# Patient Record
Sex: Female | Born: 1970 | Race: Black or African American | Hispanic: No | Marital: Married | Smoking: Never smoker
Health system: Southern US, Community
[De-identification: ages and names within clinical notes are randomized; demographics above are authoritative.]

## PROBLEM LIST (undated history)

## (undated) DIAGNOSIS — F32A Depression, unspecified: Secondary | ICD-10-CM

## (undated) DIAGNOSIS — N6452 Nipple discharge: Secondary | ICD-10-CM

## (undated) DIAGNOSIS — M1990 Unspecified osteoarthritis, unspecified site: Secondary | ICD-10-CM

## (undated) DIAGNOSIS — F419 Anxiety disorder, unspecified: Secondary | ICD-10-CM

## (undated) DIAGNOSIS — D242 Benign neoplasm of left breast: Secondary | ICD-10-CM

## (undated) DIAGNOSIS — F329 Major depressive disorder, single episode, unspecified: Secondary | ICD-10-CM

## (undated) HISTORY — PX: TUBAL LIGATION: SHX77

## (undated) HISTORY — PX: SHOULDER ARTHROSCOPY: SHX128

---

## 2016-12-03 ENCOUNTER — Other Ambulatory Visit: Payer: Self-pay | Admitting: Surgical Oncology

## 2016-12-03 DIAGNOSIS — R922 Inconclusive mammogram: Secondary | ICD-10-CM

## 2016-12-03 DIAGNOSIS — N6452 Nipple discharge: Secondary | ICD-10-CM

## 2016-12-24 DIAGNOSIS — N6452 Nipple discharge: Secondary | ICD-10-CM

## 2016-12-24 HISTORY — DX: Nipple discharge: N64.52

## 2016-12-25 ENCOUNTER — Ambulatory Visit
Admission: RE | Admit: 2016-12-25 | Discharge: 2016-12-25 | Disposition: A | Discharge status: Home / Self Care | Payer: Medicaid Other | Source: Ambulatory Visit | Attending: Surgical Oncology | Admitting: Surgical Oncology

## 2016-12-25 ENCOUNTER — Other Ambulatory Visit: Payer: Self-pay | Admitting: Surgical Oncology

## 2016-12-25 DIAGNOSIS — N6320 Unspecified lump in the left breast, unspecified quadrant: Secondary | ICD-10-CM

## 2016-12-25 DIAGNOSIS — N6452 Nipple discharge: Secondary | ICD-10-CM

## 2016-12-25 DIAGNOSIS — R922 Inconclusive mammogram: Secondary | ICD-10-CM

## 2016-12-25 HISTORY — DX: Nipple discharge: N64.52

## 2017-01-08 ENCOUNTER — Other Ambulatory Visit: Payer: Self-pay | Admitting: General Surgery

## 2017-01-08 DIAGNOSIS — D242 Benign neoplasm of left breast: Secondary | ICD-10-CM

## 2017-01-16 ENCOUNTER — Other Ambulatory Visit: Payer: Self-pay | Admitting: General Surgery

## 2017-01-16 DIAGNOSIS — D242 Benign neoplasm of left breast: Secondary | ICD-10-CM

## 2017-01-17 NOTE — Pre-Procedure Instructions (Signed)
    Anne Nixon  01/17/2017      [id number] [place] - [address] AT [address] [place] [address] Phone: [phone] Fax: [phone]    Your procedure is scheduled on Tues. Mar. 27 at 1130 AM.  Report to [facility] at 930 A.M.  Call this number if you have problems the morning of surgery:[phone]   Remember:  Do not eat food or drink liquids after midnight.  Take these medicines the morning of surgery with A SIP OF WATER clonazepam (klonopin), levothyroxine (synthroid), loratadine (claritin), methocarbamol (robaxin), oxycodone (percocet)-if needed, proair HFA inhaler.  Stop taking any Vitamins or Herbal Medications. No Aspirin Goody's, BC's, Aleve, Advil, Motrin, Ibuprofen or Fish oil.   Do not wear jewelry, make-up or nail polish.  Do not wear lotions, powders, or perfumes, or deoderant.  Do not shave 48 hours prior to surgery.  M  Do not bring valuables to the hospital.  [facility] is not responsible for any belongings or valuables.  Contacts, dentures or bridgework may not be worn into surgery.  Leave your suitcase in the car.  After surgery it may be brought to your room.  For patients admitted to the hospital, discharge time will be determined by your treatment team.  Patients discharged the day of surgery will not be allowed to drive home.    Special instructions:  See attached  Please read over the following fact sheets that you were given. Pain Booklet, Coughing and Deep Breathing and Surgical Site Infection Prevention

## 2017-01-17 NOTE — Progress Notes (Signed)
PCP:  Cardiologist:  EKG:  Stress test:  ECHO:  Cardiac cath:  Chest x-ray:

## 2017-01-18 ENCOUNTER — Inpatient Hospital Stay: Admission: RE | Admit: 2017-01-18 | Payer: Medicaid Other | Source: Ambulatory Visit

## 2017-01-18 ENCOUNTER — Inpatient Hospital Stay (HOSPITAL_COMMUNITY)
Admission: RE | Admit: 2017-01-18 | Discharge: 2017-01-18 | Disposition: A | Discharge status: Home / Self Care | Payer: Medicaid Other | Source: Ambulatory Visit

## 2017-02-04 NOTE — Progress Notes (Signed)
Stephanie at Dr Darrel Hoover notified that we cannot reach patient, all #'s have no answer, no VM set up. There have been multiple attempts made to reach patient.

## 2017-02-06 ENCOUNTER — Ambulatory Visit
Admission: RE | Admit: 2017-02-06 | Discharge: 2017-02-06 | Disposition: A | Discharge status: Home / Self Care | Payer: Medicaid Other | Source: Ambulatory Visit | Attending: General Surgery | Admitting: General Surgery

## 2017-02-06 DIAGNOSIS — D242 Benign neoplasm of left breast: Secondary | ICD-10-CM

## 2017-02-06 NOTE — H&P (Signed)
Anne Nixon  Location: [facility] Patient #: [id number] DOB: 09-Mar-1971 Single / Language: [name] / Race: Black or African American Female        History of Present Illness        The patient is a 46 year old female who presents with a complaint of papilloma left breast. This is a 46 year old female, referred by Dr. Franki Nixon at the breast center at [facility] for evaluation of clear nipple discharge from the left breast and a biopsy of the left breast showing papilloma. Her PCP is Anne Nixon in [place] in [place]. Family practice resident Dr. Lajuana Nixon is present throughout the encounter      She has no prior history of breast problems. She gives a two-month history of clear left nipple discharge. Sometimes spontaneous and sometimes elicited. Imaging studies showed category B density but the mammogram is normal. Ultrasound, however, she has a dilated duct in the left retroareolar area. There is some echogenic material within the duct that has a vascular flow in the lower inner quadrant. Ultrasound of the axilla is negative. There is also imaging abnormality on the right.      Bilateral image guided biopsy showed fibrocystic disease on the right. Fragment of intraductal papilloma on the left. She was referred for excision on the left. The drainage has stopped on the left side since the biopsy which is not unusual.      Past history revealed hypertension and hypothyroidism but otherwise normal. Family history reveals mother died of colon cancer. Father died of heart failure. No family history of ovarian or breast cancer. She is single and lives in [place] had 5 children . One child died following an assault. Denies alcohol or tobacco. She is on unemployed. Takes care of the other children. Has a fiance who is supports her.       I have recommended biopsy of this area and she agrees. She will be scheduled for left breast lumpectomy with  radioactive seed localization. We will ask for lacrimal duct probes in case the duct is draining. I discussed the indications, details, techniques, and numerous risk of the surgery with her. She is aware of the risk of bleeding, infection, reoperation if cancer, high likelihood of nipple flattening, loss of erectile function, and permanent numbness. Other cosmetic deformity. Chronic pain. She understands these issues well. All of her questions are answered. She agrees with this plan.   Past Surgical History  Breast Biopsy  Bilateral. Knee Surgery  Left. Shoulder Surgery  Left.  Diagnostic Studies History  Colonoscopy  never Mammogram  within last year  Allergies  Ibuprin *ANALGESICS - ANTI-INFLAMMATORY*  Anaphylaxis. Naproxen *ANALGESICS - ANTI-INFLAMMATORY*  Anaphylaxis. Azithromycin *CHEMICALS*  Anaphylaxis.  Medication History  Lisinopril (20MG  Tablet, Oral) Active. HydroCHLOROthiazide (25MG  Tablet, Oral) Active. Levothyroxine Sodium (25MCG Tablet, Oral) Active. Loratadine (10MG  Tablet, Oral) Active. Medications Reconciled  Social History  No alcohol use  Tobacco use  Never smoker.  Family History  Alcohol Abuse  Family Members In General. Anesthetic complications  Family Members In General. Diabetes Mellitus  Father. Heart disease in female family member before age 36  Hypertension  Father.  Pregnancy / Birth History  Age at menarche  67 years. Gravida  5 Maternal age  81-25 Para  5 Regular periods   Other Problems  Arthritis  Asthma  Breast Cancer  Depression  High blood pressure  Thyroid Disease     Review of Systems  General Present- Chills and Weight Loss. Not  Present- Appetite Loss, Fatigue, Fever, Night Sweats and Weight Gain. Skin Present- Dryness. Not Present- Change in Wart/Mole, Hives, Jaundice, New Lesions, Non-Healing Wounds, Rash and Ulcer. HEENT Present- Seasonal Allergies. Not Present- Earache, Hearing  Loss, Hoarseness, Nose Bleed, Oral Ulcers, Ringing in the Ears, Sinus Pain, Sore Throat, Visual Disturbances, Wears glasses/contact lenses and Yellow Eyes. Breast Present- Breast Mass, Breast Pain and Nipple Discharge. Not Present- Skin Changes. Musculoskeletal Present- Back Pain and Joint Pain. Not Present- Joint Stiffness, Muscle Pain, Muscle Weakness and Swelling of Extremities. Neurological Present- Headaches and Tingling. Not Present- Decreased Memory, Fainting, Numbness, Seizures, Tremor, Trouble walking and Weakness. Psychiatric Present- Anxiety and Depression. Not Present- Bipolar, Change in Sleep Pattern, Fearful and Frequent crying.  Vitals  Weight: 145.8 lb Height: 61in Body Surface Area: 1.65 m Body Mass Index: 27.55 kg/m  Temp.: 98.18F  Pulse: 78 (Regular)  BP: 124/86 (Sitting, Left Arm, Standard)    Physical Exam  General Mental Status-Alert. General Appearance-Consistent with stated age. Hydration-Well hydrated. Voice-Normal.  Head and Neck Head-normocephalic, atraumatic with no lesions or palpable masses. Trachea-midline. Thyroid Gland Characteristics - normal size and consistency.  Eye Eyeball - Bilateral-Extraocular movements intact. Sclera/Conjunctiva - Bilateral-No scleral icterus.  Chest and Lung Exam Chest and lung exam reveals -quiet, even and easy respiratory effort with no use of accessory muscles and on auscultation, normal breath sounds, no adventitious sounds and normal vocal resonance. Inspection Chest Wall - Normal. Back - normal.  Breast Note: Breasts are medium size. Minimal ecchymoses on left. No hematoma. No palpable mass on either side. Nipple and areolar complexes looked normal. I could not elicit drainage from the left nipple. There is no axillary adenopathy on either side.   Cardiovascular Cardiovascular examination reveals -normal heart sounds, regular rate and rhythm with no murmurs and normal pedal  pulses bilaterally.  Abdomen Inspection Inspection of the abdomen reveals - No Hernias. Skin - Scar - no surgical scars. Palpation/Percussion Palpation and Percussion of the abdomen reveal - Soft, Non Tender, No Rebound tenderness, No Rigidity (guarding) and No hepatosplenomegaly. Auscultation Auscultation of the abdomen reveals - Bowel sounds normal.  Neurologic Neurologic evaluation reveals -alert and oriented x 3 with no impairment of recent or remote memory. Mental Status-Normal.  Musculoskeletal Normal Exam - Left-Upper Extremity Strength Normal and Lower Extremity Strength Normal. Normal Exam - Right-Upper Extremity Strength Normal and Lower Extremity Strength Normal.  Lymphatic Head & Neck  General Head & Neck Lymphatics: Bilateral - Description - Normal. Axillary  General Axillary Region: Bilateral - Description - Normal. Tenderness - Non Tender. Femoral & Inguinal  Generalized Femoral & Inguinal Lymphatics: Bilateral - Description - Normal. Tenderness - Non Tender.    Assessment & Plan  INTRADUCTAL PAPILLOMA OF BREAST, LEFT (D24.2)   You're imaging studies and biopsy show a papilloma in the left breast, slightly lower inner quadrant. The drainage has stopped since you had the biopsy and that is not unusual. The biopsy of your right breast is completely benign  Because of the clear drainage from your left nipple and the diagnosis of papilloma, there is a low but definite risk of low-grade cancer being present Excisional lumpectomy is recommended and he has stated that she would like to go ahead with that surgery you will be scheduled for left breast lumpectomy with radioactive seed localization We have discussed the indications, techniques, and risk of that surgery in detail.   HYPERTENSION, ESSENTIAL (I10) HYPOTHYROIDISM, ADULT (E03.9) FAMILY HISTORY OF COLON CANCER IN MOTHER (Z80.0)    Assata Juncaj M.  Dalbert Batman, M.D., [facility] General and Minimally invasive Surgery Breast and Colorectal Surgery Office:   [phone] Pager:   [phone]

## 2017-02-07 ENCOUNTER — Encounter (HOSPITAL_BASED_OUTPATIENT_CLINIC_OR_DEPARTMENT_OTHER)
Admission: RE | Admit: 2017-02-07 | Discharge: 2017-02-07 | Disposition: A | Discharge status: Home / Self Care | Payer: Medicaid Other | Source: Ambulatory Visit | Attending: General Surgery | Admitting: General Surgery

## 2017-02-07 ENCOUNTER — Other Ambulatory Visit: Payer: Self-pay

## 2017-02-07 ENCOUNTER — Encounter (HOSPITAL_BASED_OUTPATIENT_CLINIC_OR_DEPARTMENT_OTHER): Payer: Self-pay | Admitting: *Deleted

## 2017-02-07 DIAGNOSIS — I10 Essential (primary) hypertension: Secondary | ICD-10-CM | POA: Insufficient documentation

## 2017-02-07 DIAGNOSIS — F419 Anxiety disorder, unspecified: Secondary | ICD-10-CM | POA: Diagnosis not present

## 2017-02-07 DIAGNOSIS — D242 Benign neoplasm of left breast: Secondary | ICD-10-CM | POA: Diagnosis not present

## 2017-02-07 DIAGNOSIS — Z79899 Other long term (current) drug therapy: Secondary | ICD-10-CM | POA: Diagnosis not present

## 2017-02-07 DIAGNOSIS — E039 Hypothyroidism, unspecified: Secondary | ICD-10-CM | POA: Diagnosis not present

## 2017-02-07 DIAGNOSIS — Z8249 Family history of ischemic heart disease and other diseases of the circulatory system: Secondary | ICD-10-CM | POA: Diagnosis not present

## 2017-02-07 DIAGNOSIS — M1990 Unspecified osteoarthritis, unspecified site: Secondary | ICD-10-CM | POA: Diagnosis not present

## 2017-02-07 DIAGNOSIS — F329 Major depressive disorder, single episode, unspecified: Secondary | ICD-10-CM | POA: Diagnosis not present

## 2017-02-07 LAB — COMPREHENSIVE METABOLIC PANEL
ALT: 18 U/L (ref 14–54)
AST: 23 U/L (ref 15–41)
Albumin: 3.7 g/dL (ref 3.5–5.0)
Alkaline Phosphatase: 53 U/L (ref 38–126)
Anion gap: 10 (ref 5–15)
BUN: 11 mg/dL (ref 6–20)
CHLORIDE: 101 mmol/L (ref 101–111)
CO2: 27 mmol/L (ref 22–32)
Calcium: 8.7 mg/dL — ABNORMAL LOW (ref 8.9–10.3)
Creatinine, Ser: 0.99 mg/dL (ref 0.44–1.00)
Glucose, Bld: 130 mg/dL — ABNORMAL HIGH (ref 65–99)
POTASSIUM: 3 mmol/L — AB (ref 3.5–5.1)
SODIUM: 138 mmol/L (ref 135–145)
Total Bilirubin: 0.4 mg/dL (ref 0.3–1.2)
Total Protein: 6.6 g/dL (ref 6.5–8.1)

## 2017-02-07 LAB — CBC WITH DIFFERENTIAL/PLATELET
BASOS ABS: 0 10*3/uL (ref 0.0–0.1)
Basophils Relative: 0 %
EOS ABS: 0.2 10*3/uL (ref 0.0–0.7)
EOS PCT: 4 %
HCT: 37.8 % (ref 36.0–46.0)
Hemoglobin: 12.5 g/dL (ref 12.0–15.0)
LYMPHS PCT: 42 %
Lymphs Abs: 2.1 10*3/uL (ref 0.7–4.0)
MCH: 29.1 pg (ref 26.0–34.0)
MCHC: 33.1 g/dL (ref 30.0–36.0)
MCV: 88.1 fL (ref 78.0–100.0)
MONO ABS: 0.4 10*3/uL (ref 0.1–1.0)
Monocytes Relative: 8 %
Neutro Abs: 2.3 10*3/uL (ref 1.7–7.7)
Neutrophils Relative %: 46 %
PLATELETS: 262 10*3/uL (ref 150–400)
RBC: 4.29 MIL/uL (ref 3.87–5.11)
RDW: 13.7 % (ref 11.5–15.5)
WBC: 5 10*3/uL (ref 4.0–10.5)

## 2017-02-07 NOTE — Progress Notes (Signed)
Lab results reviewed by Dr. [employer], K+-3.0, will recheck ISTAT day of surgery.

## 2017-02-07 NOTE — Progress Notes (Signed)
Pt in for Pre Admission Testing and Interview.  EKG and Labs done. Boost Breeze given and instructions for dos reviewed with pt. Verbalized understanding.

## 2017-02-08 ENCOUNTER — Ambulatory Visit: Admit: 2017-02-08 | Payer: Medicaid Other | Admitting: General Surgery

## 2017-02-08 ENCOUNTER — Ambulatory Visit (HOSPITAL_BASED_OUTPATIENT_CLINIC_OR_DEPARTMENT_OTHER)
Admission: RE | Admit: 2017-02-08 | Discharge: 2017-02-08 | Disposition: A | Discharge status: Home / Self Care | Payer: Medicaid Other | Source: Ambulatory Visit | Attending: General Surgery | Admitting: General Surgery

## 2017-02-08 ENCOUNTER — Encounter (HOSPITAL_BASED_OUTPATIENT_CLINIC_OR_DEPARTMENT_OTHER): Payer: Self-pay | Admitting: *Deleted

## 2017-02-08 ENCOUNTER — Ambulatory Visit (HOSPITAL_BASED_OUTPATIENT_CLINIC_OR_DEPARTMENT_OTHER): Payer: Medicaid Other | Admitting: Anesthesiology

## 2017-02-08 ENCOUNTER — Encounter (HOSPITAL_BASED_OUTPATIENT_CLINIC_OR_DEPARTMENT_OTHER)
Admission: RE | Disposition: A | Discharge status: Home / Self Care | Payer: Self-pay | Source: Ambulatory Visit | Attending: General Surgery

## 2017-02-08 ENCOUNTER — Ambulatory Visit
Admission: RE | Admit: 2017-02-08 | Discharge: 2017-02-08 | Disposition: A | Discharge status: Home / Self Care | Payer: Medicaid Other | Source: Ambulatory Visit | Attending: General Surgery | Admitting: General Surgery

## 2017-02-08 DIAGNOSIS — F329 Major depressive disorder, single episode, unspecified: Secondary | ICD-10-CM | POA: Insufficient documentation

## 2017-02-08 DIAGNOSIS — E039 Hypothyroidism, unspecified: Secondary | ICD-10-CM | POA: Diagnosis not present

## 2017-02-08 DIAGNOSIS — D242 Benign neoplasm of left breast: Secondary | ICD-10-CM

## 2017-02-08 DIAGNOSIS — I10 Essential (primary) hypertension: Secondary | ICD-10-CM | POA: Diagnosis not present

## 2017-02-08 DIAGNOSIS — M1990 Unspecified osteoarthritis, unspecified site: Secondary | ICD-10-CM | POA: Insufficient documentation

## 2017-02-08 DIAGNOSIS — Z8249 Family history of ischemic heart disease and other diseases of the circulatory system: Secondary | ICD-10-CM | POA: Insufficient documentation

## 2017-02-08 DIAGNOSIS — F419 Anxiety disorder, unspecified: Secondary | ICD-10-CM | POA: Insufficient documentation

## 2017-02-08 DIAGNOSIS — Z79899 Other long term (current) drug therapy: Secondary | ICD-10-CM | POA: Insufficient documentation

## 2017-02-08 HISTORY — DX: Anxiety disorder, unspecified: F41.9

## 2017-02-08 HISTORY — DX: Major depressive disorder, single episode, unspecified: F32.9

## 2017-02-08 HISTORY — DX: Benign neoplasm of left breast: D24.2

## 2017-02-08 HISTORY — DX: Unspecified osteoarthritis, unspecified site: M19.90

## 2017-02-08 HISTORY — PX: BREAST LUMPECTOMY WITH RADIOACTIVE SEED LOCALIZATION: SHX6424

## 2017-02-08 HISTORY — DX: Depression, unspecified: F32.A

## 2017-02-08 LAB — POCT I-STAT, CHEM 8
BUN: 11 mg/dL (ref 6–20)
CHLORIDE: 99 mmol/L — AB (ref 101–111)
Calcium, Ion: 1.05 mmol/L — ABNORMAL LOW (ref 1.15–1.40)
Creatinine, Ser: 0.9 mg/dL (ref 0.44–1.00)
Glucose, Bld: 105 mg/dL — ABNORMAL HIGH (ref 65–99)
HEMATOCRIT: 39 % (ref 36.0–46.0)
Hemoglobin: 13.3 g/dL (ref 12.0–15.0)
Potassium: 3 mmol/L — ABNORMAL LOW (ref 3.5–5.1)
SODIUM: 138 mmol/L (ref 135–145)
TCO2: 29 mmol/L (ref 0–100)

## 2017-02-08 SURGERY — BREAST LUMPECTOMY WITH RADIOACTIVE SEED LOCALIZATION
Anesthesia: General | Site: Breast | Laterality: Left

## 2017-02-08 SURGERY — BREAST LUMPECTOMY WITH RADIOACTIVE SEED LOCALIZATION
Anesthesia: General | Site: Breast | Laterality: Right

## 2017-02-08 MED ORDER — DEXAMETHASONE SODIUM PHOSPHATE 10 MG/ML IJ SOLN
INTRAMUSCULAR | Status: AC
  Filled 2017-02-08: qty 1

## 2017-02-08 MED ORDER — EPHEDRINE 5 MG/ML INJ
INTRAVENOUS | Status: AC
  Filled 2017-02-08: qty 10

## 2017-02-08 MED ORDER — OXYCODONE HCL 5 MG/5ML PO SOLN: 5.0000 mg | Freq: Once | ORAL | Status: DC | PRN

## 2017-02-08 MED ORDER — FENTANYL CITRATE (PF) 100 MCG/2ML IJ SOLN
25.0000 ug | INTRAMUSCULAR | Status: DC | PRN
  Administered 2017-02-08 (×2): 50 ug via INTRAVENOUS

## 2017-02-08 MED ORDER — MIDAZOLAM HCL 2 MG/2ML IJ SOLN
INTRAMUSCULAR | Status: AC
  Filled 2017-02-08: qty 2

## 2017-02-08 MED ORDER — LIDOCAINE HCL (CARDIAC) 20 MG/ML IV SOLN
INTRAVENOUS | Status: DC | PRN
  Administered 2017-02-08: 30 mg via INTRAVENOUS

## 2017-02-08 MED ORDER — FENTANYL CITRATE (PF) 100 MCG/2ML IJ SOLN
INTRAMUSCULAR | Status: AC
  Filled 2017-02-08: qty 2

## 2017-02-08 MED ORDER — ACETAMINOPHEN 500 MG PO TABS
ORAL_TABLET | ORAL | Status: AC
  Filled 2017-02-08: qty 2

## 2017-02-08 MED ORDER — SODIUM BICARBONATE 4 % IV SOLN
INTRAVENOUS | Status: DC | PRN
  Administered 2017-02-08: 2 mL

## 2017-02-08 MED ORDER — EPHEDRINE SULFATE 50 MG/ML IJ SOLN
INTRAMUSCULAR | Status: DC | PRN
  Administered 2017-02-08: 20 mg via INTRAVENOUS

## 2017-02-08 MED ORDER — CHLORHEXIDINE GLUCONATE CLOTH 2 % EX PADS: 6.0000 | MEDICATED_PAD | Freq: Once | CUTANEOUS | Status: DC

## 2017-02-08 MED ORDER — LIDOCAINE 2% (20 MG/ML) 5 ML SYRINGE
INTRAMUSCULAR | Status: AC
  Filled 2017-02-08: qty 5

## 2017-02-08 MED ORDER — CEFAZOLIN SODIUM-DEXTROSE 2-4 GM/100ML-% IV SOLN
INTRAVENOUS | Status: AC
  Filled 2017-02-08: qty 100

## 2017-02-08 MED ORDER — ONDANSETRON HCL 4 MG/2ML IJ SOLN
INTRAMUSCULAR | Status: AC
  Filled 2017-02-08: qty 2

## 2017-02-08 MED ORDER — OXYCODONE HCL 5 MG PO TABS: 5.0000 mg | ORAL_TABLET | Freq: Once | ORAL | Status: DC | PRN

## 2017-02-08 MED ORDER — PROPOFOL 10 MG/ML IV BOLUS
INTRAVENOUS | Status: DC | PRN
  Administered 2017-02-08: 150 mg via INTRAVENOUS

## 2017-02-08 MED ORDER — LACTATED RINGERS IV SOLN: INTRAVENOUS | Status: DC

## 2017-02-08 MED ORDER — SODIUM CHLORIDE 0.9% FLUSH: 3.0000 mL | Freq: Two times a day (BID) | INTRAVENOUS | Status: DC

## 2017-02-08 MED ORDER — FENTANYL CITRATE (PF) 100 MCG/2ML IJ SOLN
50.0000 ug | INTRAMUSCULAR | Status: DC | PRN
  Administered 2017-02-08: 100 ug via INTRAVENOUS

## 2017-02-08 MED ORDER — MIDAZOLAM HCL 2 MG/2ML IJ SOLN
1.0000 mg | INTRAMUSCULAR | Status: DC | PRN
  Administered 2017-02-08: 2 mg via INTRAVENOUS

## 2017-02-08 MED ORDER — LIDOCAINE-EPINEPHRINE 1 %-1:100000 IJ SOLN
INTRAMUSCULAR | Status: DC | PRN
  Administered 2017-02-08: 18 mL

## 2017-02-08 MED ORDER — SUCCINYLCHOLINE CHLORIDE 200 MG/10ML IV SOSY
PREFILLED_SYRINGE | INTRAVENOUS | Status: AC
  Filled 2017-02-08: qty 10

## 2017-02-08 MED ORDER — GABAPENTIN 300 MG PO CAPS
ORAL_CAPSULE | ORAL | Status: AC
  Filled 2017-02-08: qty 1

## 2017-02-08 MED ORDER — ACETAMINOPHEN 325 MG PO TABS: 650.0000 mg | ORAL_TABLET | ORAL | Status: DC | PRN

## 2017-02-08 MED ORDER — HYDROCODONE-ACETAMINOPHEN 5-325 MG PO TABS: 1.0000 | ORAL_TABLET | Freq: Four times a day (QID) | ORAL | 0 refills | Status: AC | PRN

## 2017-02-08 MED ORDER — SCOPOLAMINE 1 MG/3DAYS TD PT72
1.0000 | MEDICATED_PATCH | Freq: Once | TRANSDERMAL | Status: DC | PRN
Start: 2017-02-08 — End: 2017-02-08

## 2017-02-08 MED ORDER — SODIUM CHLORIDE 0.9 % IV SOLN: 250.0000 mL | INTRAVENOUS | Status: DC | PRN

## 2017-02-08 MED ORDER — PROPOFOL 10 MG/ML IV BOLUS
INTRAVENOUS | Status: AC
  Filled 2017-02-08: qty 20

## 2017-02-08 MED ORDER — OXYCODONE HCL 5 MG PO TABS: 5.0000 mg | ORAL_TABLET | ORAL | Status: DC | PRN

## 2017-02-08 MED ORDER — SODIUM CHLORIDE 0.9% FLUSH: 3.0000 mL | INTRAVENOUS | Status: DC | PRN

## 2017-02-08 MED ORDER — ACETAMINOPHEN 650 MG RE SUPP: 650.0000 mg | RECTAL | Status: DC | PRN

## 2017-02-08 MED ORDER — CEFAZOLIN SODIUM-DEXTROSE 2-4 GM/100ML-% IV SOLN
2.0000 g | INTRAVENOUS | Status: AC
  Administered 2017-02-08: 2 g via INTRAVENOUS

## 2017-02-08 MED ORDER — CELECOXIB 200 MG PO CAPS
ORAL_CAPSULE | ORAL | Status: AC
Start: 2017-02-08 — End: 2017-02-08
  Filled 2017-02-08: qty 2

## 2017-02-08 MED ORDER — DEXAMETHASONE SODIUM PHOSPHATE 4 MG/ML IJ SOLN
INTRAMUSCULAR | Status: DC | PRN
  Administered 2017-02-08: 10 mg via INTRAVENOUS

## 2017-02-08 MED ORDER — ACETAMINOPHEN 500 MG PO TABS
1000.0000 mg | ORAL_TABLET | ORAL | Status: AC
  Administered 2017-02-08: 1000 mg via ORAL

## 2017-02-08 MED ORDER — LACTATED RINGERS IV SOLN
INTRAVENOUS | Status: DC
  Administered 2017-02-08 (×2): via INTRAVENOUS

## 2017-02-08 MED ORDER — SODIUM BICARBONATE 4 % IV SOLN
INTRAVENOUS | Status: AC
  Filled 2017-02-08: qty 5

## 2017-02-08 MED ORDER — ONDANSETRON HCL 4 MG/2ML IJ SOLN
INTRAMUSCULAR | Status: DC | PRN
  Administered 2017-02-08: 4 mg via INTRAVENOUS

## 2017-02-08 MED ORDER — GABAPENTIN 300 MG PO CAPS
300.0000 mg | ORAL_CAPSULE | ORAL | Status: AC
  Administered 2017-02-08: 300 mg via ORAL

## 2017-02-08 MED ORDER — PHENYLEPHRINE 40 MCG/ML (10ML) SYRINGE FOR IV PUSH (FOR BLOOD PRESSURE SUPPORT)
PREFILLED_SYRINGE | INTRAVENOUS | Status: AC
  Filled 2017-02-08: qty 10

## 2017-02-08 MED ORDER — CELECOXIB 400 MG PO CAPS
400.0000 mg | ORAL_CAPSULE | ORAL | Status: AC
  Administered 2017-02-08: 400 mg via ORAL

## 2017-02-08 MED ORDER — FENTANYL CITRATE (PF) 100 MCG/2ML IJ SOLN: 25.0000 ug | INTRAMUSCULAR | Status: DC | PRN

## 2017-02-08 SURGICAL SUPPLY — 59 items
APPLIER CLIP 9.375 MED OPEN (MISCELLANEOUS)
BENZOIN TINCTURE PRP APPL 2/3 (GAUZE/BANDAGES/DRESSINGS) IMPLANT
BINDER BREAST LRG (GAUZE/BANDAGES/DRESSINGS) ×3 IMPLANT
BINDER BREAST MEDIUM (GAUZE/BANDAGES/DRESSINGS) IMPLANT
BINDER BREAST XLRG (GAUZE/BANDAGES/DRESSINGS) IMPLANT
BINDER BREAST XXLRG (GAUZE/BANDAGES/DRESSINGS) IMPLANT
BLADE HEX COATED 2.75 (ELECTRODE) ×3 IMPLANT
BLADE SURG 10 STRL SS (BLADE) IMPLANT
BLADE SURG 15 STRL LF DISP TIS (BLADE) ×1 IMPLANT
BLADE SURG 15 STRL SS (BLADE) ×2
CANISTER SUC SOCK COL 7IN (MISCELLANEOUS) IMPLANT
CANISTER SUCT 1200ML W/VALVE (MISCELLANEOUS) ×3 IMPLANT
CHLORAPREP W/TINT 26ML (MISCELLANEOUS) ×3 IMPLANT
CLIP APPLIE 9.375 MED OPEN (MISCELLANEOUS) IMPLANT
CLOSURE WOUND 1/2 X4 (GAUZE/BANDAGES/DRESSINGS)
COVER BACK TABLE 60X90IN (DRAPES) ×3 IMPLANT
COVER MAYO STAND STRL (DRAPES) ×3 IMPLANT
COVER PROBE W GEL 5X96 (DRAPES) ×3 IMPLANT
DECANTER SPIKE VIAL GLASS SM (MISCELLANEOUS) IMPLANT
DERMABOND ADVANCED (GAUZE/BANDAGES/DRESSINGS) ×2
DERMABOND ADVANCED .7 DNX12 (GAUZE/BANDAGES/DRESSINGS) ×1 IMPLANT
DEVICE DUBIN W/COMP PLATE 8390 (MISCELLANEOUS) ×3 IMPLANT
DRAPE LAPAROSCOPIC ABDOMINAL (DRAPES) ×3 IMPLANT
DRAPE UTILITY XL STRL (DRAPES) ×3 IMPLANT
DRSG PAD ABDOMINAL 8X10 ST (GAUZE/BANDAGES/DRESSINGS) IMPLANT
ELECT REM PT RETURN 9FT ADLT (ELECTROSURGICAL) ×3
ELECTRODE REM PT RTRN 9FT ADLT (ELECTROSURGICAL) ×1 IMPLANT
GAUZE SPONGE 4X4 12PLY STRL LF (GAUZE/BANDAGES/DRESSINGS) IMPLANT
GLOVE EUDERMIC 7 POWDERFREE (GLOVE) ×3 IMPLANT
GOWN STRL REUS W/ TWL LRG LVL3 (GOWN DISPOSABLE) ×1 IMPLANT
GOWN STRL REUS W/ TWL XL LVL3 (GOWN DISPOSABLE) ×1 IMPLANT
GOWN STRL REUS W/TWL LRG LVL3 (GOWN DISPOSABLE) ×2
GOWN STRL REUS W/TWL XL LVL3 (GOWN DISPOSABLE) ×2
ILLUMINATOR WAVEGUIDE N/F (MISCELLANEOUS) IMPLANT
KIT MARKER MARGIN INK (KITS) ×3 IMPLANT
LIGHT WAVEGUIDE WIDE FLAT (MISCELLANEOUS) IMPLANT
NEEDLE HYPO 25X1 1.5 SAFETY (NEEDLE) ×3 IMPLANT
NS IRRIG 1000ML POUR BTL (IV SOLUTION) ×3 IMPLANT
PACK BASIN DAY SURGERY FS (CUSTOM PROCEDURE TRAY) ×3 IMPLANT
PENCIL BUTTON HOLSTER BLD 10FT (ELECTRODE) ×3 IMPLANT
SHEET MEDIUM DRAPE 40X70 STRL (DRAPES) IMPLANT
SLEEVE SCD COMPRESS KNEE MED (MISCELLANEOUS) ×3 IMPLANT
SPONGE LAP 18X18 X RAY DECT (DISPOSABLE) IMPLANT
SPONGE LAP 4X18 X RAY DECT (DISPOSABLE) ×3 IMPLANT
STRIP CLOSURE SKIN 1/2X4 (GAUZE/BANDAGES/DRESSINGS) IMPLANT
SUT ETHILON 3 0 FSL (SUTURE) IMPLANT
SUT MNCRL AB 4-0 PS2 18 (SUTURE) ×3 IMPLANT
SUT SILK 2 0 SH (SUTURE) ×3 IMPLANT
SUT VIC AB 2-0 CT1 27 (SUTURE)
SUT VIC AB 2-0 CT1 TAPERPNT 27 (SUTURE) IMPLANT
SUT VIC AB 3-0 SH 27 (SUTURE)
SUT VIC AB 3-0 SH 27X BRD (SUTURE) IMPLANT
SUT VICRYL 3-0 CR8 SH (SUTURE) ×3 IMPLANT
SYR 10ML LL (SYRINGE) ×3 IMPLANT
TOWEL OR 17X24 6PK STRL BLUE (TOWEL DISPOSABLE) ×3 IMPLANT
TOWEL OR NON WOVEN STRL DISP B (DISPOSABLE) ×3 IMPLANT
TUBE CONNECTING 20'X1/4 (TUBING) ×1
TUBE CONNECTING 20X1/4 (TUBING) ×2 IMPLANT
YANKAUER SUCT BULB TIP NO VENT (SUCTIONS) ×3 IMPLANT

## 2017-02-08 NOTE — Anesthesia Procedure Notes (Signed)
Procedure Name: LMA Insertion Date/Time: 02/08/2017 9:28 AM Performed by: Melynda Ripple D Pre-anesthesia Checklist: Patient identified, Emergency Drugs available, Suction available and Patient being monitored Patient Re-evaluated:Patient Re-evaluated prior to inductionOxygen Delivery Method: Circle system utilized Preoxygenation: Pre-oxygenation with 100% oxygen Intubation Type: IV induction Ventilation: Mask ventilation without difficulty LMA: LMA inserted LMA Size: 3.0 Number of attempts: 1 Airway Equipment and Method: Bite block Placement Confirmation: positive ETCO2 Tube secured with: Tape Dental Injury: Teeth and Oropharynx as per pre-operative assessment

## 2017-02-08 NOTE — Op Note (Addendum)
Patient Name:           Anne Nixon   Date of Surgery:        02/08/2017  Pre op Diagnosis:      Papilloma left breast  Post op Diagnosis:    Papilloma left breast  Procedure:                 Left breast lumpectomy with radioactive seed localization and margin assessment  Surgeon:                     Edsel Petrin. Dalbert Batman, M.D., FACS  Assistant:                      OR staff   Indication for Assistant: n/a  Operative Indications:    This is a 46 year old female, referred by Dr. Franki Cabot at the breast center at [facility] for evaluation of clear nipple discharge from the left breast and a biopsy of the left breast showing papilloma. Her PCP is [facility] in [place] in [place].       She has no prior history of breast problems. She gives a two-month history of clear left nipple discharge. Sometimes spontaneous and sometimes elicited. Imaging studies showed category B density but the mammogram is normal. Ultrasound, however, she has a dilated duct in the left retroareolar area. There is some echogenic material within the duct that has a vascular flow in the lower inner quadrant. Ultrasound of the axilla is negative. There is also imaging abnormality on the right.      Bilateral image guided biopsy showed fibrocystic disease on the right. Fragment of intraductal papilloma on the left. She was referred for excision on the left. The drainage has stopped on the left side since the biopsy which is not unusual.       I have recommended biopsy of this area and she agrees. She will be scheduled for left breast lumpectomy with radioactive seed localization.She agrees with this plan.  Operative Findings:       The biopsy marker in the radioactive seed were at the 6:00 position of the left breast.  This was about 3 cm inferior to the areolar margin but we were able to get this out through a circumareolar incision.  I was able to elicit some clear straw-colored drainage from a  central left nipple duct.  I tried repeatedly to intubate this with a lacrimal duct probe but it would not go more than about 5 mm deep.  The specimen mammogram looked good with the marker clip and the seed within the relative center of the specimen.  Procedure in Detail:          Following the induction of general LMA anesthesia the patient's left breast was prepped and draped in a sterile fashion.  Surgical timeout was performed.  Intravenous antibiotics were given.  1% Xylocaine with epinephrine was used as a local infiltration anesthetic.     I attempted to intubate the draining duct with lacrimal duct probes but was unsuccessful.  I made a circumareolar incision at the areolar margin inferiorly.  Using the neoprobe as a guide I dissected out a specimen about 2.5 cm in diameter or less. .  The specimen was removed and marked with sutures and a 6 color ink kit to orient the pathologist.  The specimen mammogram looked good as described above and the specimen was marked and sent to the lab.  Hemostasis was  excellent.  The wound was irrigated.  The lumpectomy cavity was marked with 5 metal clips according to our protocol.  The breast tissues were reapproximated with several interrupted sutures of 3-0 Vicryl and the skin closed with a running subcuticular 4-0 Monocryl and Dermabond.  Breast binder was placed and the patient taken to PACU in stable condition.  EBL 10 mL.  Counts correct.  Complications none.     Edsel Petrin. Dalbert Batman, M.D., FACS General and Minimally Invasive Surgery Breast and Colorectal Surgery   Addendum: The patient was given a prescription for Norco for postop pain.  I locked onto the [place] website and reviewed her prescription medication history   02/08/2017 10:21 AM

## 2017-02-08 NOTE — Anesthesia Postprocedure Evaluation (Addendum)
Anesthesia Post Note  Patient: Anne Nixon  Procedure(s) Performed: Procedure(s) (LRB): LEFT BREAST LUMPECTOMY WITH RADIOACTIVE SEED LOCALIZATION (Left)  Patient location during evaluation: PACU Anesthesia Type: General Level of consciousness: awake and alert Pain management: pain level controlled Vital Signs Assessment: post-procedure vital signs reviewed and stable Respiratory status: spontaneous breathing, nonlabored ventilation, respiratory function stable and patient connected to nasal cannula oxygen Cardiovascular status: blood pressure returned to baseline and stable Postop Assessment: no signs of nausea or vomiting Anesthetic complications: no       Last Vitals:  Vitals:   02/08/17 1100 02/08/17 1115  BP: 115/81 122/88  Pulse: 94 94  Resp: 11 12  Temp:      Last Pain:  Vitals:   02/08/17 1100  TempSrc:   PainSc: Asleep                 Adja Ruff

## 2017-02-08 NOTE — Anesthesia Preprocedure Evaluation (Signed)
Anesthesia Evaluation  Patient identified by MRN, date of birth, ID band Patient awake    Reviewed: Allergy & Precautions, NPO status , Patient's Chart, lab work & pertinent test results  History of Anesthesia Complications Negative for: history of anesthetic complications  Airway Mallampati: II  TM Distance: >3 FB Neck ROM: Full    Dental  (+) Teeth Intact   Pulmonary neg pulmonary ROS,    breath sounds clear to auscultation       Cardiovascular negative cardio ROS   Rhythm:Regular     Neuro/Psych PSYCHIATRIC DISORDERS Anxiety Depression    GI/Hepatic negative GI ROS, Neg liver ROS,   Endo/Other  negative endocrine ROS  Renal/GU negative Renal ROS     Musculoskeletal  (+) Arthritis ,   Abdominal   Peds  Hematology negative hematology ROS (+)   Anesthesia Other Findings   Reproductive/Obstetrics                             Anesthesia Physical Anesthesia Plan  ASA: II  Anesthesia Plan: General   Post-op Pain Management:    Induction: Intravenous  Airway Management Planned: LMA  Additional Equipment: None  Intra-op Plan:   Post-operative Plan: Extubation in OR  Informed Consent: I have reviewed the patients History and Physical, chart, labs and discussed the procedure including the risks, benefits and alternatives for the proposed anesthesia with the patient or authorized representative who has indicated his/her understanding and acceptance.   Dental advisory given  Plan Discussed with: CRNA and Surgeon  Anesthesia Plan Comments:         Anesthesia Quick Evaluation

## 2017-02-08 NOTE — Interval H&P Note (Signed)
History and Physical Interval Note:  02/08/2017 8:44 AM  Anne Nixon  has presented today for surgery, with the diagnosis of LEFT BREAST PAPILLOMA  The various methods of treatment have been discussed with the patient and family. After consideration of risks, benefits and other options for treatment, the patient has consented to  Procedure(s): LEFT BREAST LUMPECTOMY WITH RADIOACTIVE SEED LOCALIZATION (Left) as a surgical intervention .  The patient's history has been reviewed, patient examined, no change in status, stable for surgery.  I have reviewed the patient's chart and labs.  Questions were answered to the patient's satisfaction.     Adin Hector

## 2017-02-08 NOTE — Discharge Instructions (Signed)
[facility] °Office Phone Number [phone] ° °BREAST BIOPSY/ PARTIAL MASTECTOMY: POST OP INSTRUCTIONS ° °Always review your discharge instruction sheet given to you by the facility where your surgery was performed. ° °IF YOU HAVE DISABILITY OR FAMILY LEAVE FORMS, YOU MUST BRING THEM TO THE OFFICE FOR PROCESSING.  DO NOT GIVE THEM TO YOUR DOCTOR. ° °1. A prescription for pain medication may be given to you upon discharge.  Take your pain medication as prescribed, if needed.  If narcotic pain medicine is not needed, then you may take acetaminophen (Tylenol) or ibuprofen (Advil) as needed. °2. Take your usually prescribed medications unless otherwise directed °3. If you need a refill on your pain medication, please contact your pharmacy.  They will contact our office to request authorization.  Prescriptions will not be filled after 5pm or on week-ends. °4. You should eat very light the first 24 hours after surgery, such as soup, crackers, pudding, etc.  Resume your normal diet the day after surgery. °5. Most patients will experience some swelling and bruising in the breast.  Ice packs and a good support bra will help.  Swelling and bruising can take several days to resolve.  °6. It is common to experience some constipation if taking pain medication after surgery.  Increasing fluid intake and taking a stool softener will usually help or prevent this problem from occurring.  A mild laxative (Milk of Magnesia or Miralax) should be taken according to package directions if there are no bowel movements after 48 hours. °7. Unless discharge instructions indicate otherwise, you may remove your bandages 24-48 hours after surgery, and you may shower at that time.  You may have steri-strips (small skin tapes) in place directly over the incision.  These strips should be left on the skin for 7-10 days.  If your surgeon used skin glue on the incision, you may shower in 24 hours.  The glue will flake off over the  next 2-3 weeks.  Any sutures or staples will be removed at the office during your follow-up visit. °8. ACTIVITIES:  You may resume regular daily activities (gradually increasing) beginning the next day.  Wearing a good support bra or sports bra minimizes pain and swelling.  You may have sexual intercourse when it is comfortable. °a. You may drive when you no longer are taking prescription pain medication, you can comfortably wear a seatbelt, and you can safely maneuver your car and apply brakes. °b. RETURN TO WORK:  ______________________________________________________________________________________ °9. You should see your doctor in the office for a follow-up appointment approximately two weeks after your surgery.  Your doctor’s nurse will typically make your follow-up appointment when she calls you with your pathology report.  Expect your pathology report 2-3 business days after your surgery.  You may call to check if you do not hear from us after three days. °10. OTHER INSTRUCTIONS: _______________________________________________________________________________________________ _____________________________________________________________________________________________________________________________________ °_____________________________________________________________________________________________________________________________________ °_____________________________________________________________________________________________________________________________________ ° °WHEN TO CALL YOUR DOCTOR: °1. Fever over 101.0 °2. Nausea and/or vomiting. °3. Extreme swelling or bruising. °4. Continued bleeding from incision. °5. Increased pain, redness, or drainage from the incision. ° °The clinic staff is available to answer your questions during regular business hours.  Please don’t hesitate to call and ask to speak to one of the nurses for clinical concerns.  If you have a medical emergency, go to the nearest  emergency room or call 911.  A surgeon from [facility] is always on call at the hospital. ° °For further questions, please visit [email or web address]  ° ° ° ° °  Post Anesthesia Home Care Instructions ° °Activity: °Get plenty of rest for the remainder of the day. A responsible individual must stay with you for 24 hours following the procedure.  °For the next 24 hours, DO NOT: °-Drive a car °-Operate machinery °-Drink alcoholic beverages °-Take any medication unless instructed by your physician °-Make any legal decisions or sign important papers. ° °Meals: °Start with liquid foods such as gelatin or soup. Progress to regular foods as tolerated. Avoid greasy, spicy, heavy foods. If nausea and/or vomiting occur, drink only clear liquids until the nausea and/or vomiting subsides. Call your physician if vomiting continues. ° °Special Instructions/Symptoms: °Your throat may feel dry or sore from the anesthesia or the breathing tube placed in your throat during surgery. If this causes discomfort, gargle with warm salt water. The discomfort should disappear within 24 hours. ° °If you had a scopolamine patch placed behind your ear for the management of post- operative nausea and/or vomiting: ° °1. The medication in the patch is effective for 72 hours, after which it should be removed.  Wrap patch in a tissue and discard in the trash. Wash hands thoroughly with soap and water. °2. You may remove the patch earlier than 72 hours if you experience unpleasant side effects which may include dry mouth, dizziness or visual disturbances. °3. Avoid touching the patch. Wash your hands with soap and water after contact with the patch. °  ° °

## 2017-02-08 NOTE — Transfer of Care (Signed)
Immediate Anesthesia Transfer of Care Note  Patient: Brayleigh Rybacki  Procedure(s) Performed: Procedure(s): LEFT BREAST LUMPECTOMY WITH RADIOACTIVE SEED LOCALIZATION (Left)  Patient Location: PACU  Anesthesia Type:General  Level of Consciousness: awake and alert   Airway & Oxygen Therapy: Patient Spontanous Breathing and Patient connected to face mask oxygen  Post-op Assessment: Report given to RN and Post -op Vital signs reviewed and stable  Post vital signs: Reviewed and stable  Last Vitals:  Vitals:   02/08/17 0806  BP: 107/65  Pulse: (!) 102  Resp: 16  Temp: 36.8 C    Last Pain:  Vitals:   02/08/17 0806  TempSrc: Oral  PainSc: 8       Patients Stated Pain Goal: 3 (51/[id number])  Complications: No apparent anesthesia complications

## 2017-02-11 ENCOUNTER — Encounter (HOSPITAL_BASED_OUTPATIENT_CLINIC_OR_DEPARTMENT_OTHER): Payer: Self-pay | Admitting: General Surgery

## 2017-02-11 NOTE — Progress Notes (Signed)
Inform patient of Pathology report,. 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  There is no evidence of malignancy.  This is excellent.  I will discuss this with her in detail at her next office visit  hmi

## 2017-04-01 NOTE — Addendum Note (Signed)
Addendum  created 04/01/17 1355 by Oleta Mouse, MD   Sign clinical note

## 2017-11-13 ENCOUNTER — Other Ambulatory Visit: Payer: Self-pay | Admitting: Cardiology

## 2018-10-13 IMAGING — MG STEREOTACTIC VACUUM ASSIST RIGHT
8 of 13 series · 8 of 17 positions shown · non-contrast
Comparison: Previous exams.

ADDENDUM:
Pathology revealed fibrocystic changes with extensive sclerosing
adenosis in the right breast. The left breast showed fibrocystic
changes and a microscopic focus which may represent the edge of a
sclerosing ductal papilloma in the left breast. Excision is
recommended for the left breast mass. This was found to be
concordant by Dr. Ebadat Tiger. Pathology results were discussed
with the patient by telephone. The patient reported doing well after
the biopsies. Post biopsy instructions and care were reviewed and
questions were answered. The patient was encouraged to call The
patient request, she would like to see a surgeon in [[email or web address]].
Surgical consultation has been arranged with Dr. Teetee Mino at
[[email or web address]] on January 08, 2017.

Pathology results reported by Auntyjatty Delowr RN, BSN on 12/27/2016.
CLINICAL DATA: Patient presents today for stereotactic biopsy, with
3D tomosynthesis guidance, for a developing asymmetry in the
upper-outer quadrant of the right breast.
EXAM:
RIGHT BREAST STEREOTACTIC CORE NEEDLE BIOPSY

[R (1 of 8)]
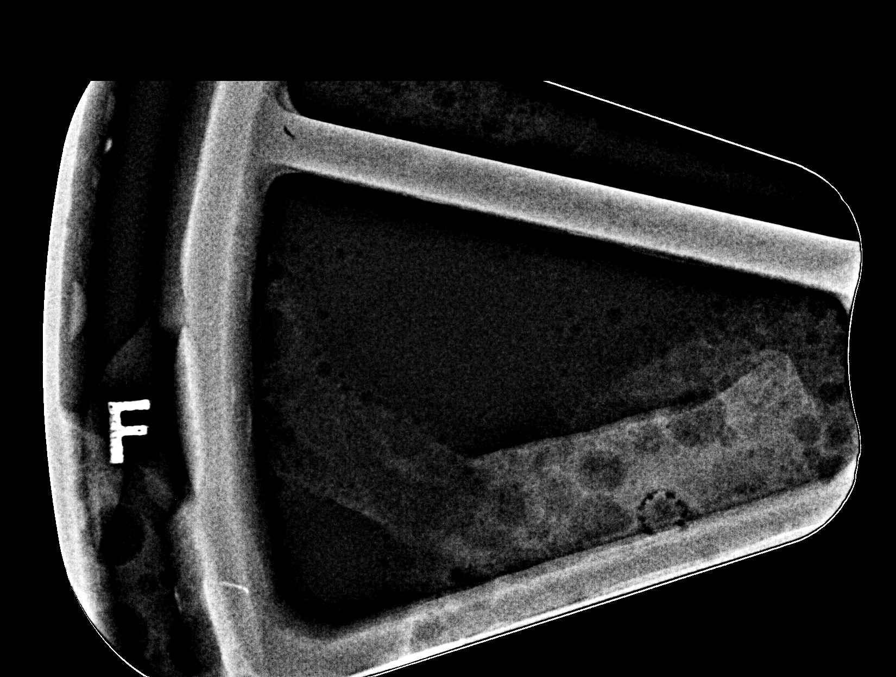

[R (2 of 8)]
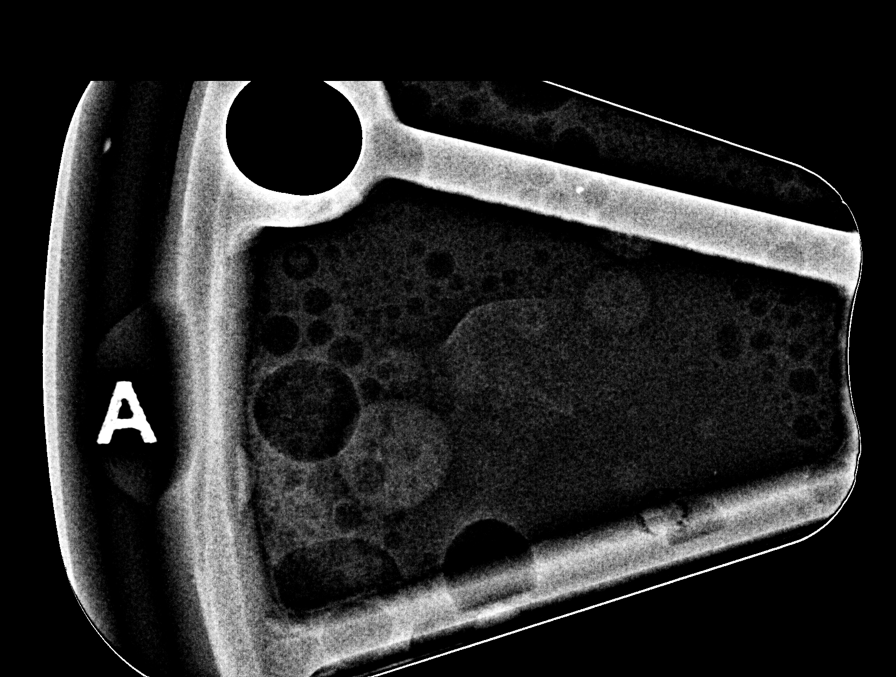

[R (3 of 8)]
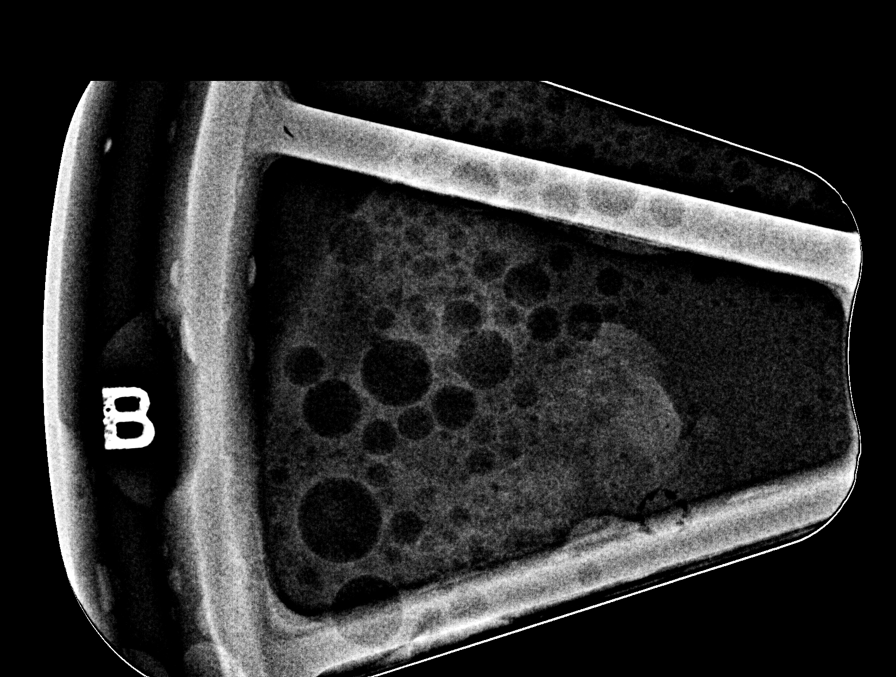

[R (4 of 8)]
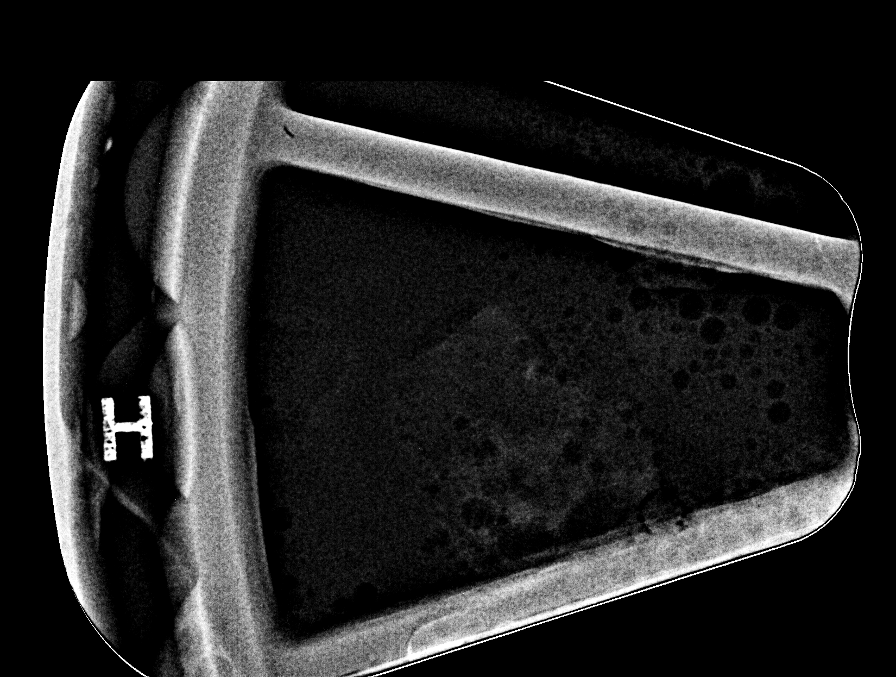

[R (5 of 8)]
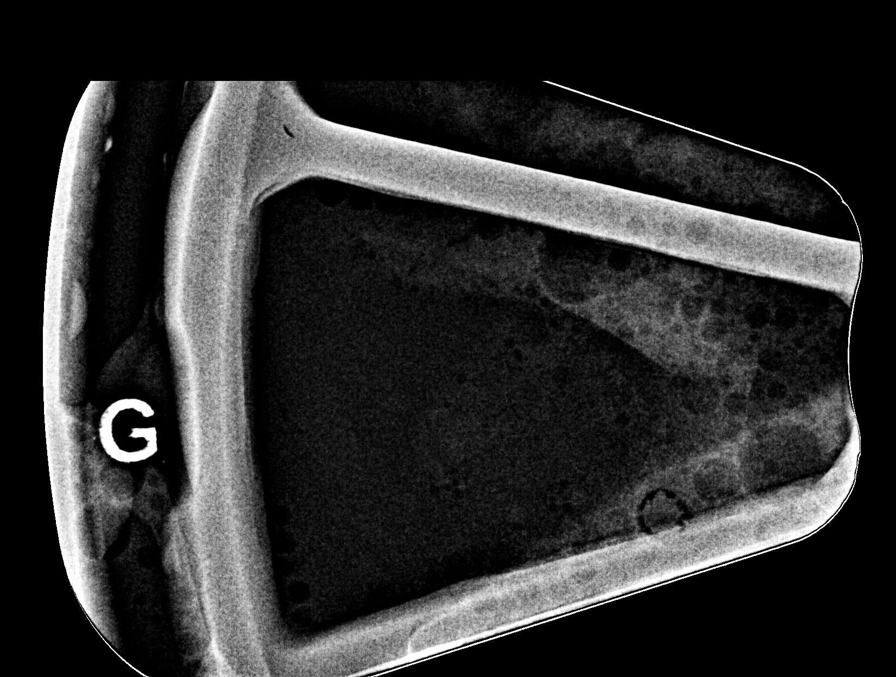

[R (6 of 8)]
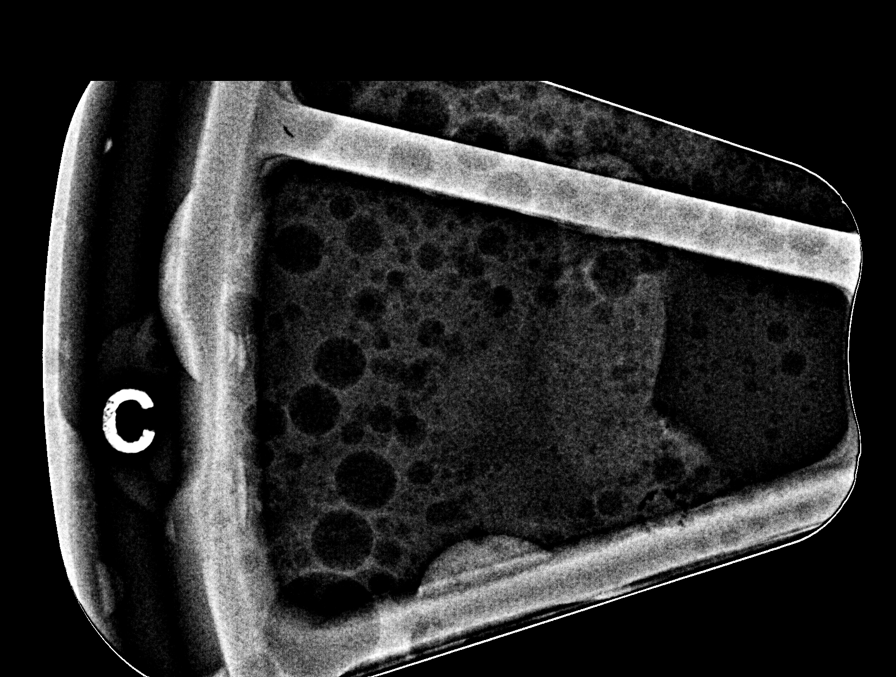

[R (7 of 8)]
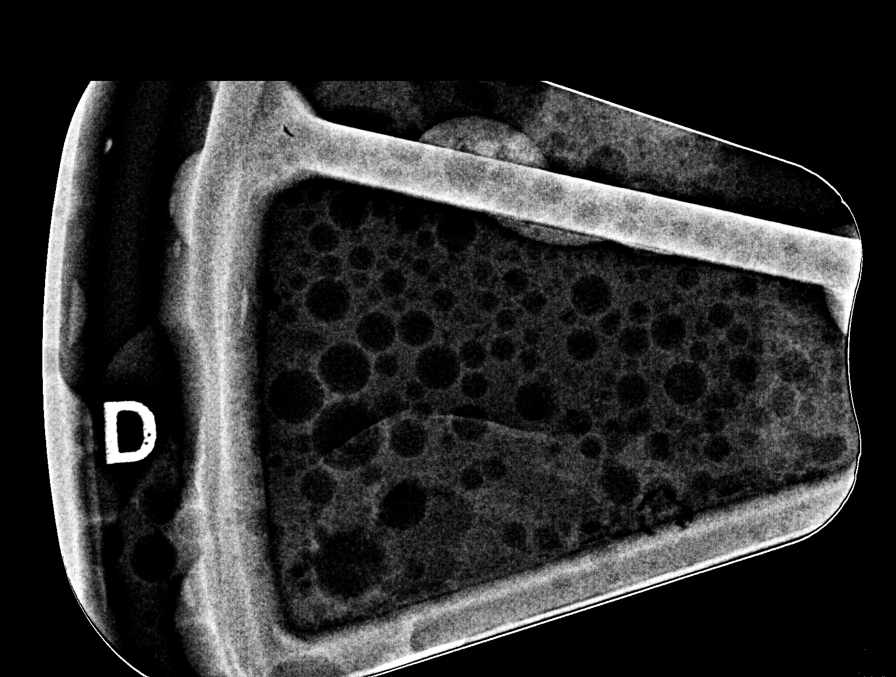

[R (8 of 8)]
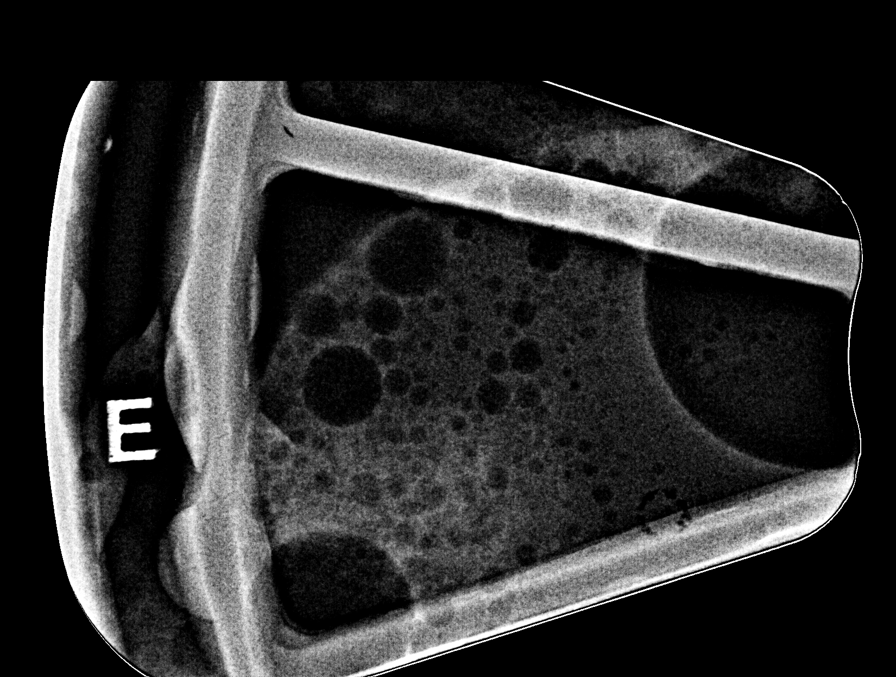

[8 of 17 positions shown; findings below may reference images not displayed]



Using sterile technique and 1% Lidocaine as local anesthetic, under
stereotactic guidance, a 9 gauge vacuum assisted device was used to
perform core needle biopsy of the developing asymmetry in the
upper-outer quadrant of the right breast using a superior approach.

At the conclusion of the procedure, a coil shaped tissue marker clip
was deployed into the biopsy cavity. Follow-up 2-view mammogram was
performed and dictated separately.
IMPRESSION: Stereotactic-guided biopsy of the developing asymmetry in the
upper-outer quadrant of the right breast.. No apparent
complications.
